# Patient Record
Sex: Female | Born: 1985 | Race: Black or African American | Hispanic: No | Marital: Single
Health system: Southern US, Community
[De-identification: ages and names within clinical notes are randomized; demographics above are authoritative.]

---

## 2020-05-22 ENCOUNTER — Emergency Department (HOSPITAL_COMMUNITY)
Admission: EM | Admit: 2020-05-22 | Discharge: 2020-05-22 | Disposition: A | Discharge status: Home / Self Care | Payer: Medicare Other | Attending: Emergency Medicine | Admitting: Emergency Medicine

## 2020-05-22 ENCOUNTER — Encounter (HOSPITAL_COMMUNITY): Payer: Self-pay | Admitting: Emergency Medicine

## 2020-05-22 ENCOUNTER — Emergency Department (HOSPITAL_COMMUNITY): Payer: Medicare Other

## 2020-05-22 DIAGNOSIS — R1012 Left upper quadrant pain: Secondary | ICD-10-CM | POA: Diagnosis not present

## 2020-05-22 DIAGNOSIS — R109 Unspecified abdominal pain: Secondary | ICD-10-CM

## 2020-05-22 DIAGNOSIS — D649 Anemia, unspecified: Secondary | ICD-10-CM | POA: Insufficient documentation

## 2020-05-22 DIAGNOSIS — R1013 Epigastric pain: Secondary | ICD-10-CM | POA: Diagnosis present

## 2020-05-22 DIAGNOSIS — R350 Frequency of micturition: Secondary | ICD-10-CM | POA: Diagnosis not present

## 2020-05-22 DIAGNOSIS — E876 Hypokalemia: Secondary | ICD-10-CM | POA: Insufficient documentation

## 2020-05-22 DIAGNOSIS — R319 Hematuria, unspecified: Secondary | ICD-10-CM | POA: Insufficient documentation

## 2020-05-22 LAB — COMPREHENSIVE METABOLIC PANEL
ALT: 26 U/L (ref 0–44)
AST: 21 U/L (ref 15–41)
Albumin: 3 g/dL — ABNORMAL LOW (ref 3.5–5.0)
Alkaline Phosphatase: 78 U/L (ref 38–126)
Anion gap: 6 (ref 5–15)
BUN: 11 mg/dL (ref 6–20)
CO2: 26 mmol/L (ref 22–32)
Calcium: 8.8 mg/dL — ABNORMAL LOW (ref 8.9–10.3)
Chloride: 107 mmol/L (ref 98–111)
Creatinine, Ser: 1 mg/dL (ref 0.44–1.00)
GFR, Estimated: >60 mL/min (ref >60)
Glucose, Bld: 105 mg/dL — ABNORMAL HIGH (ref 70–99)
Potassium: 3.4 mmol/L — ABNORMAL LOW (ref 3.5–5.1)
Sodium: 139 mmol/L (ref 135–145)
Total Bilirubin: 0.3 mg/dL (ref 0.3–1.2)
Total Protein: 6.8 g/dL (ref 6.5–8.1)

## 2020-05-22 LAB — URINALYSIS, ROUTINE W REFLEX MICROSCOPIC
Bilirubin Urine: NEGATIVE
Glucose, UA: NEGATIVE mg/dL
Hgb urine dipstick: NEGATIVE
Ketones, ur: NEGATIVE mg/dL
Leukocytes,Ua: NEGATIVE
Nitrite: NEGATIVE
Protein, ur: NEGATIVE mg/dL
Specific Gravity, Urine: 1.029 (ref 1.005–1.030)
pH: 6 (ref 5.0–8.0)

## 2020-05-22 LAB — CBC
HCT: 34 % — ABNORMAL LOW (ref 36.0–46.0)
Hemoglobin: 10.5 g/dL — ABNORMAL LOW (ref 12.0–15.0)
MCH: 25.2 pg — ABNORMAL LOW (ref 26.0–34.0)
MCHC: 30.9 g/dL (ref 30.0–36.0)
MCV: 81.5 fL (ref 80.0–100.0)
Platelets: 405 10*3/uL — ABNORMAL HIGH (ref 150–400)
RBC: 4.17 MIL/uL (ref 3.87–5.11)
RDW: 16.3 % — ABNORMAL HIGH (ref 11.5–15.5)
WBC: 8.7 10*3/uL (ref 4.0–10.5)
nRBC: 0 % (ref 0.0–0.2)

## 2020-05-22 LAB — I-STAT BETA HCG BLOOD, ED (MC, WL, AP ONLY): I-stat hCG, quantitative: <5 m[IU]/mL (ref <5)

## 2020-05-22 LAB — LIPASE, BLOOD: Lipase: 26 U/L (ref 11–51)

## 2020-05-22 MED ORDER — SODIUM CHLORIDE 0.9 % IV BOLUS
1000.0000 mL | Freq: Once | INTRAVENOUS | Status: AC
  Administered 2020-05-22: 1000 mL via INTRAVENOUS

## 2020-05-22 MED ORDER — KETOROLAC TROMETHAMINE 60 MG/2ML IM SOLN
30.0000 mg | Freq: Once | INTRAMUSCULAR | Status: AC
  Administered 2020-05-22: 30 mg via INTRAMUSCULAR
  Filled 2020-05-22: qty 2

## 2020-05-22 NOTE — Discharge Instructions (Addendum)
The testing today did not show any serious problems.  You may have some gastritis causing your abdominal discomfort.  Try using Maalox, liquid antacid or Tums, chewable antacid for 5 times a day to help your discomfort.  Follow-up with your medical doctor if you are not better in 1 week.  Return here if needed.

## 2020-05-22 NOTE — ED Provider Notes (Signed)
4:30 PM-checkout from Dr. Rosalia Hammers to evaluate patient after return of CT imaging to evaluate for kidney stone and assess results of urinalysis.\  8:55 PM-urinalysis is normal.  CT scan abdomen pelvis reviewed by radiologist, no acute abnormalities.  Incidental note of bilateral inguinal adenopathy is made.  At bedside she is calm comfortable.  Vital signs are reassuring.  She states that she is having midepigastric discomfort, but no flank pain at this time.  The abdomen is soft and nontender.  I advised her to use antacid for possible gastritis.  Findings discussed with her and all questions were answered   Mancel Bale, MD 05/22/20 2102

## 2020-05-22 NOTE — ED Triage Notes (Signed)
Pt arrives to ED with chief complaint LLQ pain via ems from home. Pain in LLQ began last night and radiates into low groin and left flank.

## 2020-05-22 NOTE — ED Provider Notes (Signed)
[facility] EMERGENCY DEPARTMENT Provider Note   CSN: [id number] Arrival date & time: 05/22/20  1218     History Chief Complaint  Patient presents with  . Abdominal Pain    Lori Boyer is a 35 y.o. female.  HPI  35 year old female history of seizures presents today complaining of epigastric pain that began yesterday.  She has some urinary frequency.  Today the pain is into her left flank and radiates down the left thigh.  She noted some blood in her urine.  She denies nausea, vomiting, diarrhea, fever, pregnancy, STI, abnormal vaginal discharge.  Patient reports no similar symptoms in the past.  No history of urinary tract infection or kidney stones.    History reviewed. No pertinent past medical history.  There are no problems to display for this patient.   History reviewed. No pertinent surgical history.   OB History   No obstetric history on file.     History reviewed. No pertinent family history.     Home Medications Prior to Admission medications   Not on File    Allergies    Patient has no known allergies.  Review of Systems   Review of Systems  All other systems reviewed and are negative.   Physical Exam Updated Vital Signs BP (!) 137/91 (BP Location: Left Arm)   Pulse 81   Temp 98.2 F (36.8 C)   Resp 14   SpO2 100%   Physical Exam Vitals and nursing note reviewed.  Constitutional:      Appearance: She is well-developed.  HENT:     Head: Normocephalic and atraumatic.     Right Ear: External ear normal.     Left Ear: External ear normal.     Nose: Nose normal.  Eyes:     Conjunctiva/sclera: Conjunctivae normal.     Pupils: Pupils are equal, round, and reactive to light.  Neck:     Thyroid: No thyromegaly.     Vascular: No JVD.     Trachea: No tracheal deviation.  Cardiovascular:     Rate and Rhythm: Normal rate and regular rhythm.     Heart sounds: Normal heart sounds.  Pulmonary:     Effort: Pulmonary effort is  normal.     Breath sounds: Normal breath sounds. No wheezing.  Abdominal:     General: Bowel sounds are normal. There is no distension.     Palpations: Abdomen is soft. There is no mass.     Tenderness: There is abdominal tenderness in the epigastric area, left upper quadrant and left lower quadrant. There is no right CVA tenderness, left CVA tenderness or guarding.     Hernia: No hernia is present.  Musculoskeletal:        General: Normal range of motion.     Cervical back: Normal range of motion and neck supple.  Lymphadenopathy:     Cervical: No cervical adenopathy.  Skin:    General: Skin is warm and dry.  Neurological:     Mental Status: She is alert and oriented to person, place, and time.     GCS: GCS eye subscore is 4. GCS verbal subscore is 5. GCS motor subscore is 6.     Cranial Nerves: No cranial nerve deficit.     Sensory: No sensory deficit.     Gait: Gait normal.     Deep Tendon Reflexes: Reflexes are normal and symmetric. Babinski sign absent on the right side. Babinski sign absent on the left side.  Reflex Scores:      Bicep reflexes are 2+ on the right side and 2+ on the left side.      Patellar reflexes are 2+ on the right side and 2+ on the left side.    Comments: Strength is normal and equal throughout. Cranial nerves grossly intact. Patient fluent. No gross ataxia and patient able to ambulate without difficulty.  Psychiatric:        Behavior: Behavior normal.        Thought Content: Thought content normal.        Judgment: Judgment normal.     ED Results / Procedures / Treatments   Labs (all labs ordered are listed, but only abnormal results are displayed) Labs Reviewed  COMPREHENSIVE METABOLIC PANEL - Abnormal; Notable for the following components:      Result Value   Potassium 3.4 (*)    Glucose, Bld 105 (*)    Calcium 8.8 (*)    Albumin 3.0 (*)    All other components within normal limits  CBC - Abnormal; Notable for the following components:    Hemoglobin 10.5 (*)    HCT 34.0 (*)    MCH 25.2 (*)    RDW 16.3 (*)    Platelets 405 (*)    All other components within normal limits  LIPASE, BLOOD  URINALYSIS, ROUTINE W REFLEX MICROSCOPIC  I-STAT BETA HCG BLOOD, ED ([facility], [facility], [facility] ONLY)    EKG None  Radiology No results found.  Procedures Procedures   Medications Ordered in ED Medications  sodium chloride 0.9 % bolus 1,000 mL (has no administration in time range)  ketorolac (TORADOL) injection 30 mg (has no administration in time range)    ED Course  I have reviewed the triage vital signs and the nursing notes.  Pertinent labs & imaging results that were available during my care of the patient were reviewed by me and considered in my medical decision making (see chart for details).    MDM Rules/Calculators/A&P                          She with epigastric pain and left flank pain.  Labs here reveal negative pregnancy test, mild anemia with hemoglobin of 10.5, mild decreased potassium at 3.4. Plan IV fluids, Toradol, CT without contrast to assess for kidney stone. CT results and urine pending Patient feels improved although not complete pain relief after Toradol. Discussed with Dr. Effie Shy and he will reevaluate after urine and CT reports return   Final Clinical Impression(s) / ED Diagnoses Final diagnoses:  Flank pain  Hematuria, unspecified type    Rx / DC Orders ED Discharge Orders    None       Margarita Grizzle, MD 05/22/20 [phone]

## 2020-07-02 ENCOUNTER — Emergency Department (HOSPITAL_COMMUNITY): Payer: Medicare Other

## 2020-07-02 ENCOUNTER — Encounter (HOSPITAL_COMMUNITY): Payer: Self-pay | Admitting: Emergency Medicine

## 2020-07-02 ENCOUNTER — Emergency Department (HOSPITAL_COMMUNITY)
Admission: EM | Admit: 2020-07-02 | Discharge: 2020-07-02 | Disposition: A | Discharge status: Home / Self Care | Payer: Medicare Other | Source: Home / Self Care | Attending: Emergency Medicine | Admitting: Emergency Medicine

## 2020-07-02 ENCOUNTER — Emergency Department (HOSPITAL_COMMUNITY)
Admission: EM | Admit: 2020-07-02 | Discharge: 2020-07-02 | Disposition: A | Discharge status: Home / Self Care | Payer: Medicare Other | Attending: Emergency Medicine | Admitting: Emergency Medicine

## 2020-07-02 ENCOUNTER — Other Ambulatory Visit: Payer: Self-pay

## 2020-07-02 DIAGNOSIS — R569 Unspecified convulsions: Secondary | ICD-10-CM

## 2020-07-02 LAB — URINALYSIS, ROUTINE W REFLEX MICROSCOPIC
Bilirubin Urine: NEGATIVE
Glucose, UA: NEGATIVE mg/dL
Hgb urine dipstick: NEGATIVE
Ketones, ur: NEGATIVE mg/dL
Leukocytes,Ua: NEGATIVE
Nitrite: NEGATIVE
Protein, ur: NEGATIVE mg/dL
Specific Gravity, Urine: 1.015 (ref 1.005–1.030)
pH: 6 (ref 5.0–8.0)

## 2020-07-02 LAB — RAPID URINE DRUG SCREEN, HOSP PERFORMED
Amphetamines: NOT DETECTED
Barbiturates: NOT DETECTED
Benzodiazepines: POSITIVE — AB
Cocaine: NOT DETECTED
Opiates: NOT DETECTED
Tetrahydrocannabinol: NOT DETECTED

## 2020-07-02 LAB — COMPREHENSIVE METABOLIC PANEL
ALT: 20 U/L (ref 0–44)
AST: 17 U/L (ref 15–41)
Albumin: 3.3 g/dL — ABNORMAL LOW (ref 3.5–5.0)
Alkaline Phosphatase: 68 U/L (ref 38–126)
Anion gap: 4 — ABNORMAL LOW (ref 5–15)
BUN: 9 mg/dL (ref 6–20)
CO2: 25 mmol/L (ref 22–32)
Calcium: 8.9 mg/dL (ref 8.9–10.3)
Chloride: 110 mmol/L (ref 98–111)
Creatinine, Ser: 0.72 mg/dL (ref 0.44–1.00)
GFR, Estimated: >60 mL/min (ref >60)
Glucose, Bld: 102 mg/dL — ABNORMAL HIGH (ref 70–99)
Potassium: 3.7 mmol/L (ref 3.5–5.1)
Sodium: 139 mmol/L (ref 135–145)
Total Bilirubin: 0.3 mg/dL (ref 0.3–1.2)
Total Protein: 7.1 g/dL (ref 6.5–8.1)

## 2020-07-02 LAB — CBC WITH DIFFERENTIAL/PLATELET
Abs Immature Granulocytes: 0.02 10*3/uL (ref 0.00–0.07)
Basophils Absolute: 0 10*3/uL (ref 0.0–0.1)
Basophils Relative: 0 %
Eosinophils Absolute: 0.1 10*3/uL (ref 0.0–0.5)
Eosinophils Relative: 1 %
HCT: 34.6 % — ABNORMAL LOW (ref 36.0–46.0)
Hemoglobin: 10.9 g/dL — ABNORMAL LOW (ref 12.0–15.0)
Immature Granulocytes: 0 %
Lymphocytes Relative: 28 %
Lymphs Abs: 2.3 10*3/uL (ref 0.7–4.0)
MCH: 25.9 pg — ABNORMAL LOW (ref 26.0–34.0)
MCHC: 31.5 g/dL (ref 30.0–36.0)
MCV: 82.2 fL (ref 80.0–100.0)
Monocytes Absolute: 0.5 10*3/uL (ref 0.1–1.0)
Monocytes Relative: 6 %
Neutro Abs: 5.5 10*3/uL (ref 1.7–7.7)
Neutrophils Relative %: 65 %
Platelets: 385 10*3/uL (ref 150–400)
RBC: 4.21 MIL/uL (ref 3.87–5.11)
RDW: 15.2 % (ref 11.5–15.5)
WBC: 8.4 10*3/uL (ref 4.0–10.5)
nRBC: 0 % (ref 0.0–0.2)

## 2020-07-02 LAB — POC URINE PREG, ED: Preg Test, Ur: NEGATIVE

## 2020-07-02 LAB — CBG MONITORING, ED: Glucose-Capillary: 91 mg/dL (ref 70–99)

## 2020-07-02 LAB — ETHANOL: Alcohol, Ethyl (B): <10 mg/dL (ref <10)

## 2020-07-02 MED ORDER — LAMOTRIGINE 100 MG PO TABS: 400.0000 mg | ORAL_TABLET | Freq: Once | ORAL | Status: DC

## 2020-07-02 MED ORDER — LAMOTRIGINE 100 MG PO TABS
200.0000 mg | ORAL_TABLET | Freq: Once | ORAL | Status: AC
  Administered 2020-07-02: 200 mg via ORAL
  Filled 2020-07-02: qty 2

## 2020-07-02 MED ORDER — IOHEXOL 350 MG/ML SOLN
75.0000 mL | Freq: Once | INTRAVENOUS | Status: AC | PRN
  Administered 2020-07-02: 75 mL via INTRAVENOUS

## 2020-07-02 MED ORDER — ACETAMINOPHEN 325 MG PO TABS
650.0000 mg | ORAL_TABLET | Freq: Once | ORAL | Status: AC
  Administered 2020-07-02: 650 mg via ORAL
  Filled 2020-07-02: qty 2

## 2020-07-02 MED ORDER — LEVETIRACETAM 500 MG PO TABS
2000.0000 mg | ORAL_TABLET | Freq: Once | ORAL | Status: AC
  Administered 2020-07-02: 2000 mg via ORAL
  Filled 2020-07-02: qty 4

## 2020-07-02 NOTE — Discharge Instructions (Signed)
Call your neurologist first thing tomorrow to schedule follow up appointment.   Continue home medications.  Do no drive

## 2020-07-02 NOTE — ED Provider Notes (Signed)
[facility]-EMERGENCY DEPT Provider Note   CSN: [id number] Arrival date & time: 07/02/20  1635     History Chief Complaint  Patient presents with  . Seizures    Lori Boyer is a 35 y.o. female with past medical history significant for epilepsy.  HPI Presented to emergency room today with chief complaint of seizures.  This is her second visit in under 24 hours.  I saw patient this morning for seizure-like activity.  She had a negative work-up and was discharged home.  While patient was ambulating out in the lobby to the cab she had witnessed seizure-like activity by a person in the lobby They did not see her hit her head..  There was no medical staff around.  When nurses arrived to the patient she was sitting up looking around.  Patient does not remember what happened. She admits to feeling tired now and generalized muscle soreness. She was not incontinent of urine and did not bite her tongue.  History reviewed. No pertinent past medical history.  There are no problems to display for this patient.   History reviewed. No pertinent surgical history.   OB History   No obstetric history on file.     History reviewed. No pertinent family history.     Home Medications Prior to Admission medications   Medication Sig Start Date End Date Taking? Authorizing Provider  cloBAZam (ONFI) 20 MG tablet Take 40 mg by mouth at bedtime. 05/08/20   [provider]  lamoTRIgine (LAMICTAL) 200 MG tablet Take 200 mg by mouth 2 (two) times daily.    [provider]  levETIRAcetam (KEPPRA) 500 MG tablet Take 2,000 mg by mouth 2 (two) times daily.    [provider]    Allergies    Cephalexin and Ibuprofen  Review of Systems   Review of Systems All other systems are reviewed and are negative for acute change except as noted in the HPI.  Physical Exam Updated Vital Signs BP (!) 142/93 (BP Location: Left Arm)   Pulse 71   Temp 98.2 F (36.8 C)    Resp 18   SpO2 95%   Physical Exam Vitals and nursing note reviewed.  Constitutional:      General: She is not in acute distress.    Appearance: She is not ill-appearing.  HENT:     Head: Normocephalic and atraumatic.     Comments: No tenderness to palpation of skull. No deformities or crepitus noted. No open wounds, abrasions or lacerations. No sinus tenderness.    Right Ear: Tympanic membrane and external ear normal.     Left Ear: Tympanic membrane and external ear normal.     Nose: Nose normal.     Mouth/Throat:     Mouth: Mucous membranes are moist.     Pharynx: Oropharynx is clear.  Eyes:     General: No scleral icterus.       Right eye: No discharge.        Left eye: No discharge.     Extraocular Movements: Extraocular movements intact.     Conjunctiva/sclera: Conjunctivae normal.     Pupils: Pupils are equal, round, and reactive to light.  Neck:     Vascular: No carotid bruit or JVD.     Comments: Full ROM intact without spinous process TTP. No bony stepoffs or deformities, no paraspinous muscle TTP or muscle spasms. No rigidity or meningeal signs. No bruising, erythema, or swelling.  Cardiovascular:     Rate and  Rhythm: Normal rate and regular rhythm.     Pulses: Normal pulses.          Radial pulses are 2+ on the right side and 2+ on the left side.     Heart sounds: Normal heart sounds.  Pulmonary:     Comments: Lungs clear to auscultation in all fields. Symmetric chest rise. No wheezing, rales, or rhonchi. Abdominal:     Comments: Abdomen is soft, non-distended, and non-tender in all quadrants. No rigidity, no guarding. No peritoneal signs.  Musculoskeletal:        General: Normal range of motion.     Cervical back: Normal range of motion. No rigidity or tenderness.     Comments: Moving all extremities without signs if injury.  Lymphadenopathy:     Cervical: No cervical adenopathy.  Skin:    General: Skin is warm and dry.     Capillary Refill: Capillary refill  takes less than 2 seconds.  Neurological:     Mental Status: She is oriented to person, place, and time.     GCS: GCS eye subscore is 4. GCS verbal subscore is 5. GCS motor subscore is 6.     Comments: Speech is clear and goal oriented, follows commands CN III-XII intact, no facial droop Normal strength in upper and lower extremities bilaterally including dorsiflexion and plantar flexion, strong and equal grip strength Sensation normal to light and sharp touch Moves extremities without ataxia, coordination intact Normal finger to nose and rapid alternating movements Normal gait and balance  Psychiatric:        Behavior: Behavior normal.     ED Results / Procedures / Treatments   Labs (all labs ordered are listed, but only abnormal results are displayed) Labs Reviewed  CBG MONITORING, ED    EKG None  Radiology CT Head Wo Contrast  Result Date: 07/02/2020 CLINICAL DATA:  34 year old female with multiple seizures today and headache. EXAM: CT HEAD WITHOUT CONTRAST TECHNIQUE: Contiguous axial images were obtained from the base of the skull through the vertex without intravenous contrast. COMPARISON:  None. FINDINGS: Brain: No evidence of acute infarction, hemorrhage, hydrocephalus, extra-axial collection or mass lesion/mass effect. Vascular: No hyperdense vessel or unexpected calcification. Skull: Normal. Negative for fracture or focal lesion. Sinuses/Orbits: No acute finding. Other: None. IMPRESSION: Unremarkable noncontrast head CT. Electronically Signed   By: Harmon Pier M.D.   On: 07/02/2020 13:59   CT VENOGRAM HEAD  Result Date: 07/02/2020 CLINICAL DATA:  Initial evaluation for acute headache. EXAM: CT VENOGRAM HEAD TECHNIQUE: Dedicated CT venogram images of the head were performed using the standard protocol. CONTRAST:  35mL OMNIPAQUE IOHEXOL 350 MG/ML SOLN COMPARISON:  Prior noncontrast head CT from earlier the same day. FINDINGS: Brain: Cerebral volume within normal limits for  patient age. No evidence for acute intracranial hemorrhage. No findings to suggest acute large vessel territory infarct. No mass lesion, midline shift, or mass effect. Ventricles are normal in size without evidence for hydrocephalus. No extra-axial fluid collection identified. Vascular: No hyperdense vessel seen prior to contrast administration. Following contrast administration, normal enhancement seen throughout the superior sagittal sinus to the level of the torcula. Transverse and sigmoid sinuses appear patent as do the visualized proximal internal jugular veins. Straight sinus, vein of Galen, internal cerebral veins, and basal veins of Rosenthal appear patent. Superior orbital veins symmetric and normal. No findings to suggest cavernous sinus thrombosis. Skull: Scalp soft tissues demonstrate no acute abnormality. Calvarium intact. Sinuses/Orbits: Globes and orbital soft tissues within normal limits.  Visualized paranasal sinuses are clear. No mastoid effusion. IMPRESSION: 1. Normal CT venogram. No evidence for dural sinus thrombosis. 2. No other acute intracranial abnormality. Electronically Signed   By: Rise Mu M.D.   On: 07/02/2020 19:52    Procedures Procedures   Medications Ordered in ED Medications  iohexol (OMNIPAQUE) 350 MG/ML injection 75 mL (75 mLs Intravenous Contrast Given 07/02/20 1920)  levETIRAcetam (KEPPRA) tablet 2,000 mg (2,000 mg Oral Given 07/02/20 2127)  lamoTRIgine (LAMICTAL) tablet 200 mg (200 mg Oral Given 07/02/20 2127)    ED Course  I have reviewed the triage vital signs and the nursing notes.  Pertinent labs & imaging results that were available during my care of the patient were reviewed by me and considered in my medical decision making (see chart for details).    MDM Rules/Calculators/A&P                          Presenting with seizure like activity immediately after discharge. No signs of serious head or neck injury. Glucose 91. Normal neuro exam. Not  postictal.   Consulted [facility] and discussed case with Dr. Donneta Romberg who recommends 4 hours observation and to attempt an EEG, although does not need to be admitted for one if unable to have it here in the ER. Patient does not need benzos or keppra load not recommended at this time as patient is at baseline.  Consulted our neurologist Dr. Selina Cooley who recommends getting CT venogram given her headaches and seizures.  At this time we are unable to have an EEG performed because it is a weekend and there is no staff.  CT venogram obtained and is negative for any acute findings. Patient observed for four hours with multiple reassessments.  She has not had any further seizure-like activity.  Patient given nighttime dose of medications and discharged home in stable condition.  Discussed the importance of outpatient follow-up with her neurologist.  Strict return precautions were discussed. Findings and plan of care discussed with supervising physician Dr. Rhunette Croft.  Portions of this note were generated with Scientist, clinical (histocompatibility and immunogenetics). Dictation errors may occur despite best attempts at proofreading.   Final Clinical Impression(s) / ED Diagnoses Final diagnoses:  Seizure [facility])    Rx / DC Orders ED Discharge Orders    None       Kandice Hams 07/02/20 2135    Derwood Kaplan, MD 07/04/20 [phone]

## 2020-07-02 NOTE — Discharge Instructions (Addendum)
-  Follow-up with your neurologist.  Call their office first thing tomorrow morning to schedule the next available appointment.  Let them know that you were seen in the ER after having seizures this weekend.  -Try your best to get sleep as well lack of sleep can bring on seizures.  Return to ER for any new or worsening conditions.

## 2020-07-02 NOTE — ED Triage Notes (Signed)
BIBA Per EMS:  Pt coming from home with complaints of having 4  witnessed seizures in past 24 hours. 2 of them she states woke her up; others occurred at 5am and 11:30am  Hx seizures  136/90  98% 133 CBG  96 HR  20G R AC

## 2020-07-02 NOTE — ED Triage Notes (Signed)
Pt comes from main lobby with seizure like activity. Pt was found by visitors experiencing seizure like movement. Pt not postictal at this time.

## 2020-07-02 NOTE — ED Provider Notes (Signed)
I provided a substantive portion of the care of this patient.  I personally performed the entirety of the medical decision making for this encounter.  EKG Interpretation  Date/Time:  Sunday Jul 02 2020 13:07:39 EDT Ventricular Rate:  68 PR Interval:  148 QRS Duration: 91 QT Interval:  390 QTC Calculation: 415 R Axis:   18 Text Interpretation: Sinus rhythm Atrial premature complex Confirmed by Lorre Nick ([id number]) on 07/02/2020 2:68:63 PM  35 year old female with history of seizure disorder who presents after multiple seizures last 24 hours.  She is neurologic intact at this time.  CBG is appropriate.  Patient has been complaining of a headache and head CT here was negative.  Will consult her neurologist for medication adjustment   Lorre Nick, MD 07/02/20 1453

## 2020-07-02 NOTE — ED Provider Notes (Signed)
[facility]-EMERGENCY DEPT Provider Note   CSN: [id number] Arrival date & time: 07/02/20  1212     History Chief Complaint  Patient presents with  . Seizures    Lori Boyer is a 35 y.o. female with past medical history significant for epilepsy. Followed by Dr. Doreen Beam at [facility].  HPI Patient presents to emergency department today via EMS with chief complaint having had 4 seizures in the last x24 hours.  Patient states they started last night while she is lying in bed.  They were not witnessed by family.  She states she was incontinent of urine.  Family witnessed seizure this morning and called EMS. Had seizure at 5 AM and 1130 AM, unsure when 2 were last night.  On their arrival she had a glucose of 133.  Patient denies being postictal after seizures.  She is unsure exactly how long they last for.  She admits to urinary incontinence.  Denies any oral injury.  Patient states she has had intermittent headaches for the last x1 month.  They started after she had a head injury.  She admits to being tased by hitting her head on the TV stand.  She has left-sided headaches.  She has 1 currently and rates it 10 at 10 in severity.  She describes the pain as a throbbing.  She has been taking Tylenol and over-the-counter pain reliever with transient symptom improvement.  She denies any associated neck pain or stiffness.  Patient denies any alcohol consumption, states she does not drink.  Denies any drug use.  She denies any recent illness.  She is prescribed Keppra and Lamictal and reports medication compliance.  She last saw neurology x8 months ago.  She denies any fever, chills, visual changes, dental injury, abdominal pain, nausea, numbness, tingling, weakness. Patient admits to lack of sleep recently as her son is sick right now. Denies any increased stress.  History reviewed. No pertinent past medical history.  There are no problems to display for this  patient.   History reviewed. No pertinent surgical history.   OB History   No obstetric history on file.     History reviewed. No pertinent family history.     Home Medications Prior to Admission medications   Medication Sig Start Date End Date Taking? Authorizing Provider  cloBAZam (ONFI) 20 MG tablet Take 40 mg by mouth at bedtime. 05/08/20  Yes [provider]  lamoTRIgine (LAMICTAL) 200 MG tablet Take 200 mg by mouth 2 (two) times daily.   Yes [provider]  levETIRAcetam (KEPPRA) 500 MG tablet Take 2,000 mg by mouth 2 (two) times daily.   Yes [provider]    Allergies    Cephalexin and Ibuprofen  Review of Systems   Review of Systems All other systems are reviewed and are negative for acute change except as noted in the HPI.  Physical Exam Updated Vital Signs BP (!) 146/99 (BP Location: Left Arm)   Pulse 94   Temp 98.8 F (37.1 C) (Oral)   Resp 11   SpO2 99%   Physical Exam Vitals and nursing note reviewed.  Constitutional:      General: She is not in acute distress.    Appearance: She is not ill-appearing.  HENT:     Head: Normocephalic and atraumatic. No raccoon eyes or Battle's sign.     Jaw: There is normal jaw occlusion.     Comments: No tenderness to palpation of skull. No deformities or crepitus noted.  No open wounds, abrasions or lacerations.     Right Ear: Tympanic membrane and external ear normal. No mastoid tenderness. No hemotympanum.     Left Ear: Tympanic membrane and external ear normal. No mastoid tenderness. No hemotympanum.     Nose: Nose normal. No signs of injury.     Mouth/Throat:     Mouth: Mucous membranes are moist.     Pharynx: Oropharynx is clear.     Comments: No injury to tongue or buccal mucosa Eyes:     General: No scleral icterus.       Right eye: No discharge.        Left eye: No discharge.     Extraocular Movements: Extraocular movements intact.     Conjunctiva/sclera: Conjunctivae normal.      Pupils: Pupils are equal, round, and reactive to light.  Neck:     Vascular: No JVD.     Comments: Full ROM intact without spinous process TTP. No bony stepoffs or deformities, no paraspinous muscle TTP or muscle spasms. No rigidity or meningeal signs. No bruising, erythema, or swelling.  Cardiovascular:     Rate and Rhythm: Normal rate and regular rhythm.     Pulses: Normal pulses.          Radial pulses are 2+ on the right side and 2+ on the left side.     Heart sounds: Normal heart sounds.  Pulmonary:     Comments: Lungs clear to auscultation in all fields. Symmetric chest rise. No wheezing, rales, or rhonchi. Abdominal:     Comments: Abdomen is soft, non-distended, and non-tender in all quadrants. No rigidity, no guarding. No peritoneal signs.  Musculoskeletal:        General: Normal range of motion.     Cervical back: Normal range of motion.  Skin:    General: Skin is warm and dry.     Capillary Refill: Capillary refill takes less than 2 seconds.  Neurological:     Mental Status: She is oriented to person, place, and time.     GCS: GCS eye subscore is 4. GCS verbal subscore is 5. GCS motor subscore is 6.     Comments: Speech is clear and goal oriented, follows commands CN III-XII intact, no facial droop Normal strength in upper and lower extremities bilaterally including dorsiflexion and plantar flexion, strong and equal grip strength Sensation normal to light and sharp touch Moves extremities without ataxia, coordination intact Normal finger to nose and rapid alternating movements Normal gait and balance  Psychiatric:        Behavior: Behavior normal.     ED Results / Procedures / Treatments   Labs (all labs ordered are listed, but only abnormal results are displayed) Labs Reviewed  CBC WITH DIFFERENTIAL/PLATELET - Abnormal; Notable for the following components:      Result Value   Hemoglobin 10.9 (*)    HCT 34.6 (*)    MCH 25.9 (*)    All other components  within normal limits  COMPREHENSIVE METABOLIC PANEL - Abnormal; Notable for the following components:   Glucose, Bld 102 (*)    Albumin 3.3 (*)    Anion gap 4 (*)    All other components within normal limits  URINALYSIS, ROUTINE W REFLEX MICROSCOPIC - Abnormal; Notable for the following components:   APPearance HAZY (*)    All other components within normal limits  RAPID URINE DRUG SCREEN, HOSP PERFORMED - Abnormal; Notable for the following components:   Benzodiazepines POSITIVE (*)  All other components within normal limits  ETHANOL  LAMOTRIGINE LEVEL  LEVETIRACETAM LEVEL  POC URINE PREG, ED    EKG EKG Interpretation  Date/Time:  Sunday Jul 02 2020 13:07:39 EDT Ventricular Rate:  68 PR Interval:  148 QRS Duration: 91 QT Interval:  390 QTC Calculation: 415 R Axis:   18 Text Interpretation: Sinus rhythm Atrial premature complex Confirmed by Lorre Nick ([id number]) on 07/02/2020 2:52:01 PM   Radiology CT Head Wo Contrast  Result Date: 07/02/2020 CLINICAL DATA:  35 year old female with multiple seizures today and headache. EXAM: CT HEAD WITHOUT CONTRAST TECHNIQUE: Contiguous axial images were obtained from the base of the skull through the vertex without intravenous contrast. COMPARISON:  None. FINDINGS: Brain: No evidence of acute infarction, hemorrhage, hydrocephalus, extra-axial collection or mass lesion/mass effect. Vascular: No hyperdense vessel or unexpected calcification. Skull: Normal. Negative for fracture or focal lesion. Sinuses/Orbits: No acute finding. Other: None. IMPRESSION: Unremarkable noncontrast head CT. Electronically Signed   By: Harmon Pier M.D.   On: 07/02/2020 13:59    Procedures Procedures   Medications Ordered in ED Medications  acetaminophen (TYLENOL) tablet 650 mg (650 mg Oral Given 07/02/20 1259)    ED Course  I have reviewed the triage vital signs and the nursing notes.  Pertinent labs & imaging results that were available during my care of the  patient were reviewed by me and considered in my medical decision making (see chart for details).    MDM Rules/Calculators/A&P                          History of epilepsy on Lamictal and Kepra. Presenting after 4 seizures. Not postictal on arrival. Normal neuro exam on arrival. Tylenol given for headache.  CBC with hemoglobin consistent with baseline, no leukocytosis.  CMP without significant electrolyte derangement. Has anion gap of 4, normal bicarb. UA without signs of infection. UDS positive for benzos, patient takes clobazam. No signs of benzo withdrawal and she denies any recreational drug use.  Ethanol level is negative. Pregnancy test is negative. Keppra and Lamictal levels in process. Given complaint of headache and head trauma x 1 month ago head CT which is negative for acute findings.  Agree with radiologist impression. EKG without obvious ischemia.  No witnessed seizures while here in ED. Consulted neurology at [facility] and case discussed with Dr. Donneta Romberg who recommends patient follow up outpatient with neurology. At this time she does not think any medication changes need to be made. Encouraged patient to have good sleep hygiene. Discussed recommendations with patient. She is agreeable to follow up outpatient. Strict return precautions discussed.  Discussed HPI, physical exam and plan of care for this patient with attending Dr. Freida Busman. The attending physician evaluated this patient as part of a shared visit and agrees with plan of care.     Portions of this note were generated with Scientist, clinical (histocompatibility and immunogenetics). Dictation errors may occur despite best attempts at proofreading.   Final Clinical Impression(s) / ED Diagnoses Final diagnoses:  Seizure [facility])    Rx / DC Orders ED Discharge Orders    None       Kandice Hams 07/02/20 1600    Lorre Nick, MD 07/03/20 [phone]

## 2020-07-02 NOTE — ED Notes (Signed)
Seizure pads place on bed for precautions.

## 2020-07-03 LAB — LAMOTRIGINE LEVEL: Lamotrigine Lvl: 1.1 ug/mL — ABNORMAL LOW (ref 2.0–20.0)

## 2020-07-06 LAB — LEVETIRACETAM LEVEL: Levetiracetam Lvl: 32.2 ug/mL (ref 10.0–40.0)

## 2023-01-15 IMAGING — CT CT RENAL STONE PROTOCOL
2 of 4 series · 17 of 46 positions shown, 19 images · non-contrast
Comparison: None.

CLINICAL DATA: Flank pain.

EXAM:
CT ABDOMEN AND PELVIS WITHOUT CONTRAST
TECHNIQUE: Multidetector CT imaging of the abdomen and pelvis was performed
following the standard protocol without IV contrast.

[Series 3: renal stone 5.0 · axial · 0.66mm/px · z∈[-590,-185]mm · 14 of 89 slices shown, 16 images]
[im 4/89  soft-tissue]
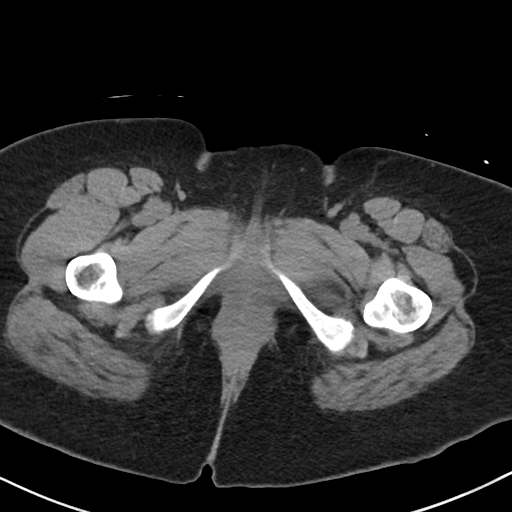
[im 4/89  bone]
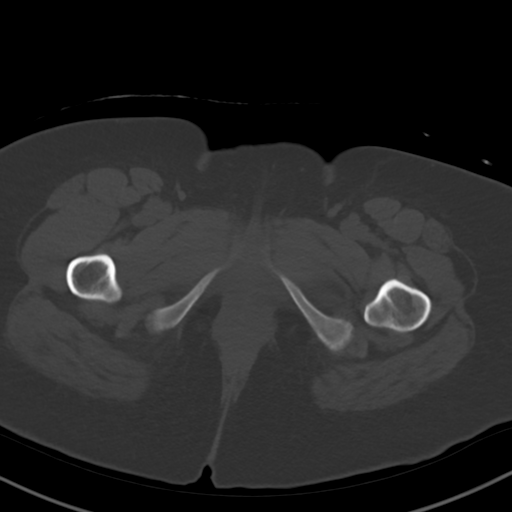
[im 11/89  soft-tissue]
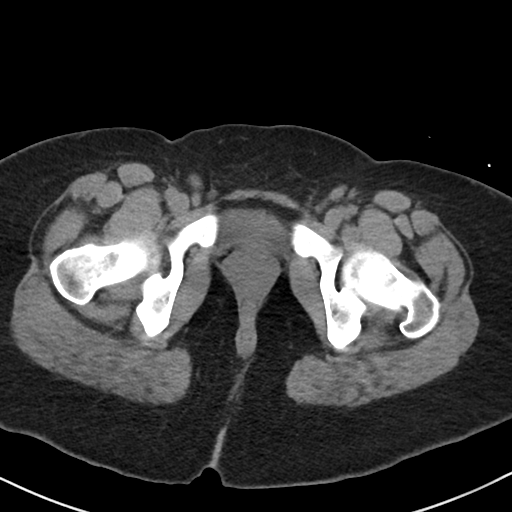
[im 18/89  soft-tissue]
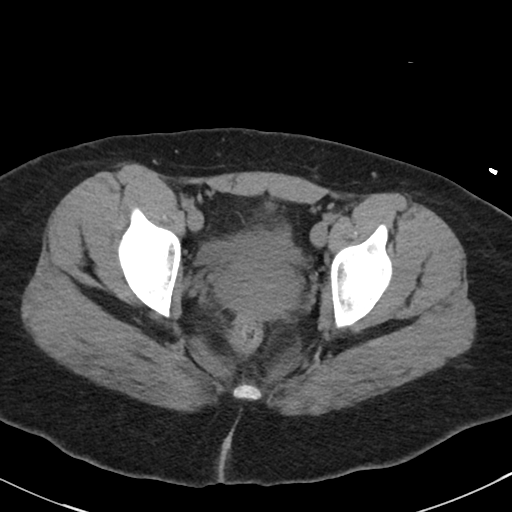
[im 25/89  soft-tissue]
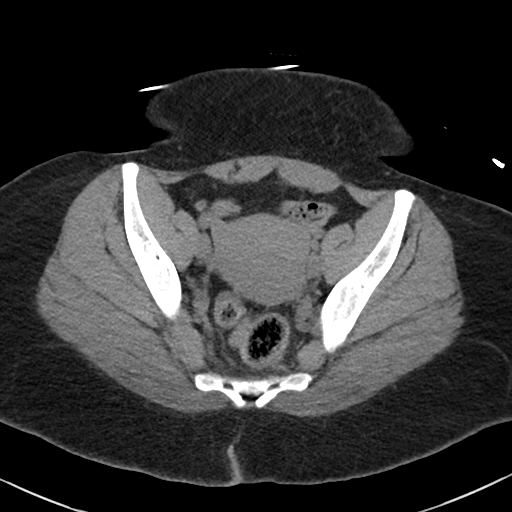
[im 29/89  soft-tissue]
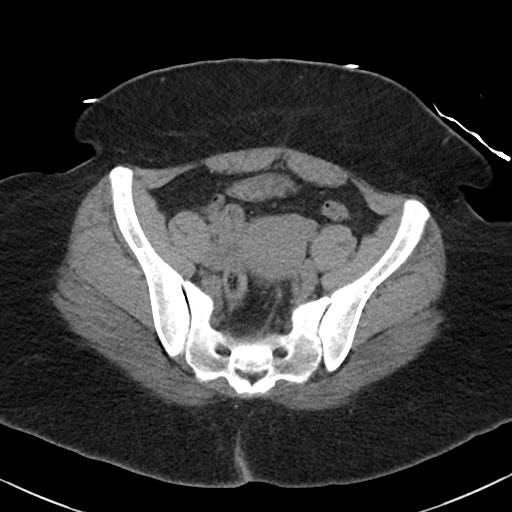
[im 36/89  soft-tissue]
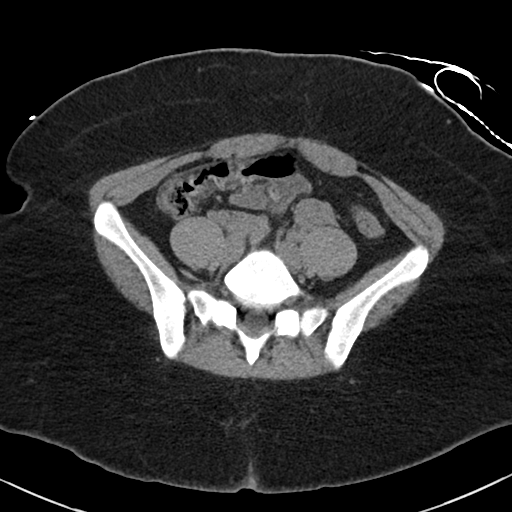
[im 43/89  soft-tissue]
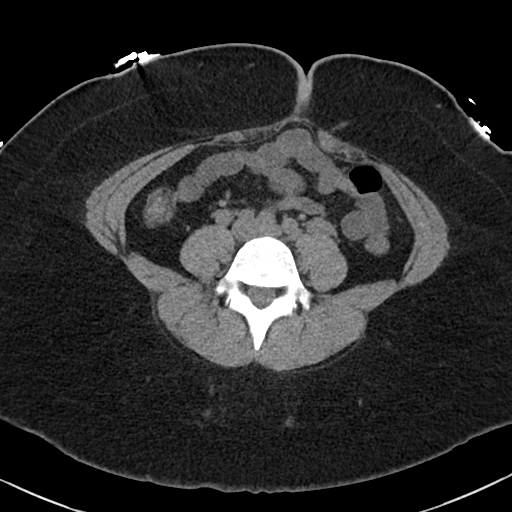
[im 46/89  soft-tissue]
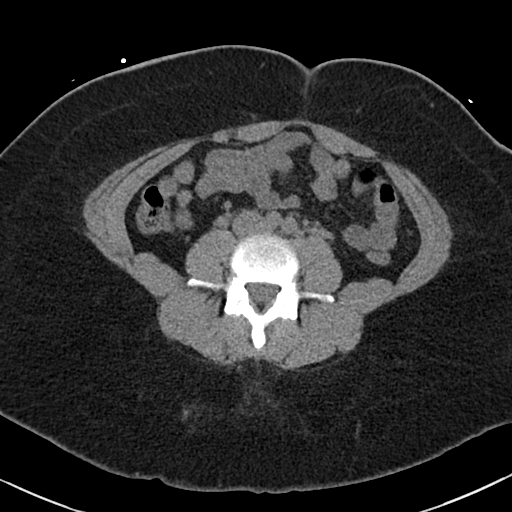
[im 53/89  soft-tissue]
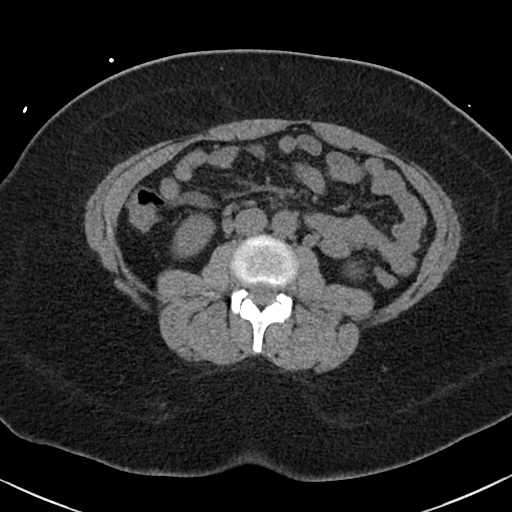
[im 53/89  bone]
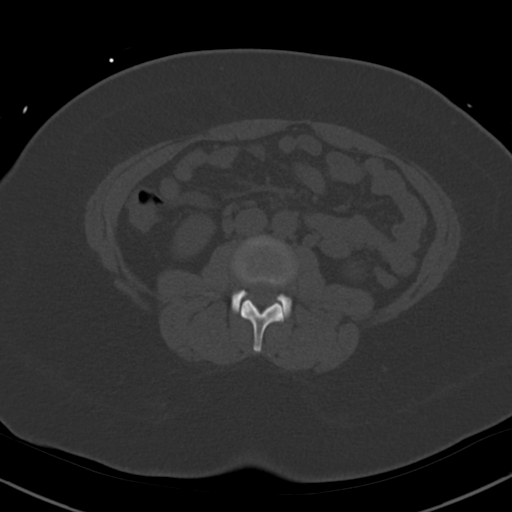
[im 60/89  soft-tissue]
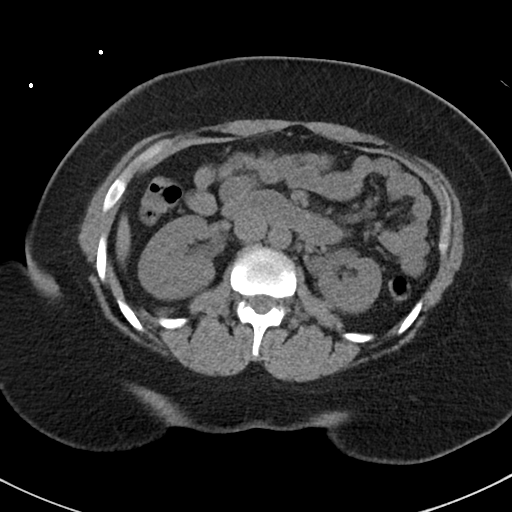
[im 67/89  soft-tissue]
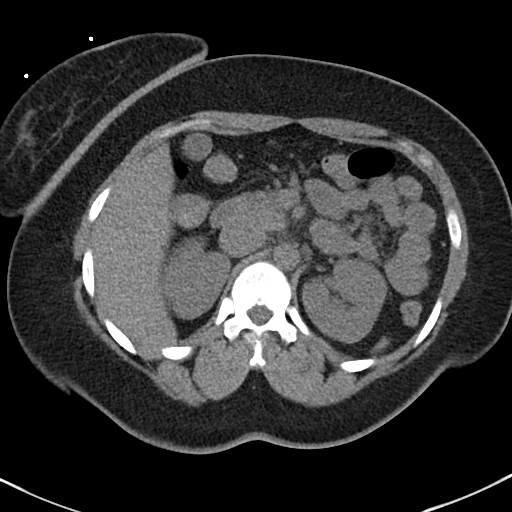
[im 71/89  soft-tissue]
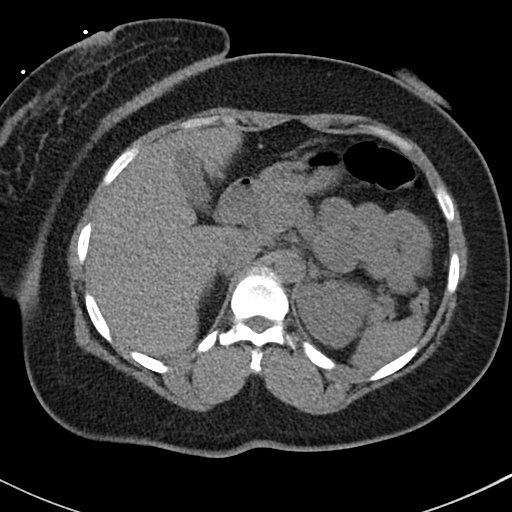
[im 78/89  soft-tissue]
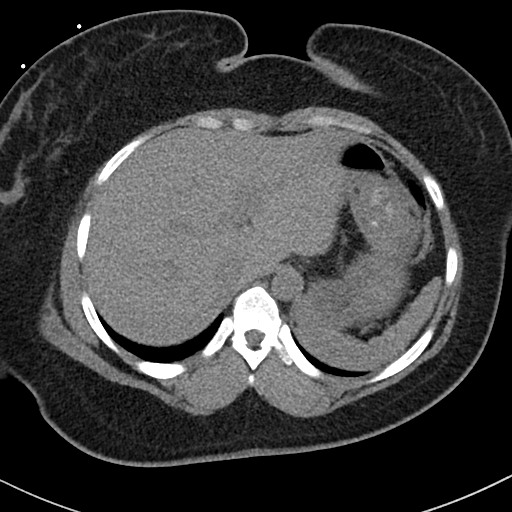
[im 85/89  soft-tissue]
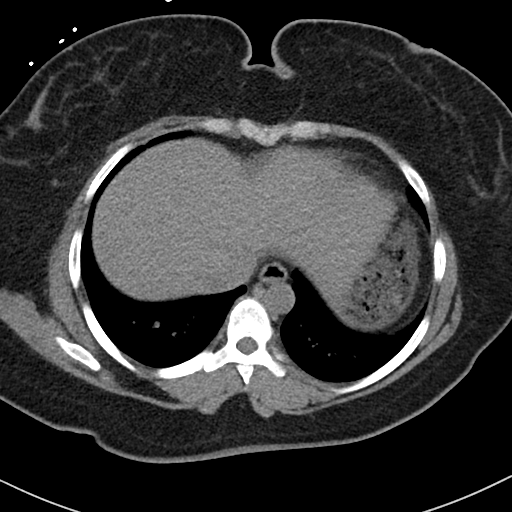

[Series 6: coronal · coronal · 0.79mm/px · 3 of 101 slices shown]
[im 34/101  soft-tissue]
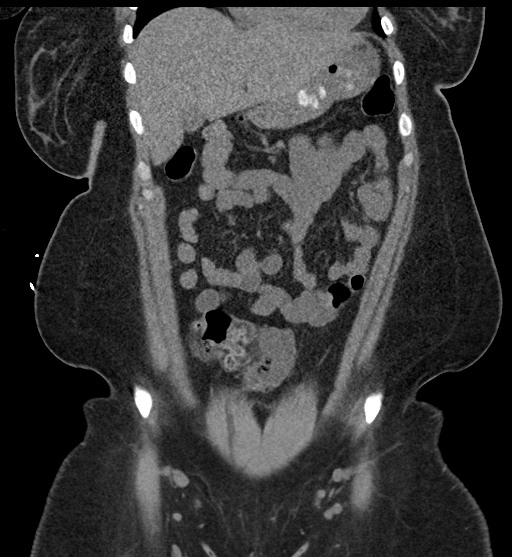
[im 45/101  soft-tissue]
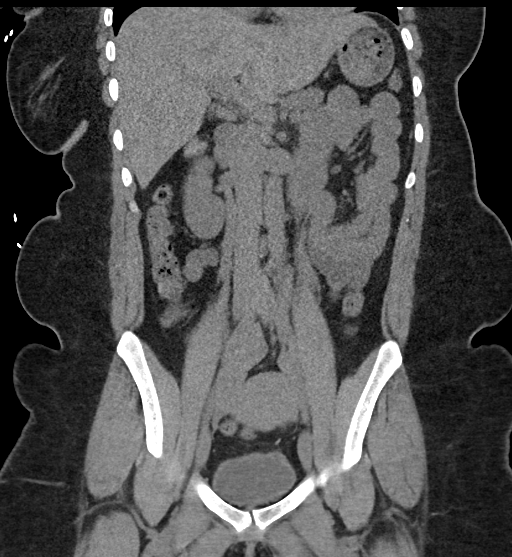
[im 56/101  soft-tissue]
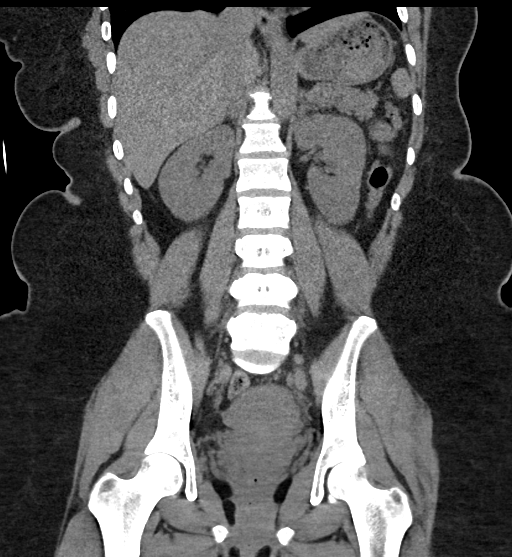

[17 of 46 positions shown; findings below may reference images not displayed]

FINDINGS: Lower chest: No acute findings. Heart is enlarged. No pericardial or
pleural effusion. Distal esophagus is grossly unremarkable.

Hepatobiliary: Liver and gallbladder are unremarkable. No biliary
ductal dilatation.

Pancreas: Negative.

Spleen: Negative.

Adrenals/Urinary Tract: Adrenal glands and kidneys are unremarkable.
No urinary stones. Ureters are decompressed. Bladder is relatively
low in volume.

Stomach/Bowel: Stomach, small bowel, appendix and colon are
unremarkable.

Vascular/Lymphatic: Vascular structures are unremarkable. Inguinal
lymph nodes measure up to 12 mm on the right. Otherwise, no
pathologically enlarged lymph nodes.

Reproductive: Uterus is visualized.  No adnexal mass.

Other: No free fluid.  Mesenteries and peritoneum are unremarkable.

Musculoskeletal: None.
IMPRESSION: 1. No findings to explain the patient's flank pain.
2. Borderline enlarged inguinal lymph nodes are nonspecific.
